# Patient Record
Sex: Male | Born: 1955 | Race: White | Hispanic: No | Marital: Married | Smoking: Never smoker
Health system: Southern US, Community
[De-identification: ages and names within clinical notes are randomized; demographics above are authoritative.]

## PROBLEM LIST (undated history)

## (undated) DIAGNOSIS — K219 Gastro-esophageal reflux disease without esophagitis: Secondary | ICD-10-CM

## (undated) DIAGNOSIS — R600 Localized edema: Secondary | ICD-10-CM

## (undated) DIAGNOSIS — G4730 Sleep apnea, unspecified: Secondary | ICD-10-CM

## (undated) DIAGNOSIS — M1990 Unspecified osteoarthritis, unspecified site: Secondary | ICD-10-CM

## (undated) DIAGNOSIS — C801 Malignant (primary) neoplasm, unspecified: Secondary | ICD-10-CM

## (undated) DIAGNOSIS — Z9989 Dependence on other enabling machines and devices: Secondary | ICD-10-CM

## (undated) DIAGNOSIS — M549 Dorsalgia, unspecified: Secondary | ICD-10-CM

## (undated) DIAGNOSIS — I10 Essential (primary) hypertension: Secondary | ICD-10-CM

## (undated) DIAGNOSIS — R609 Edema, unspecified: Secondary | ICD-10-CM

## (undated) DIAGNOSIS — E119 Type 2 diabetes mellitus without complications: Secondary | ICD-10-CM

## (undated) HISTORY — DX: Unspecified osteoarthritis, unspecified site: M19.90

## (undated) HISTORY — PX: POLYPECTOMY: SHX149

## (undated) HISTORY — PX: CHOLECYSTECTOMY: SHX55

## (undated) HISTORY — DX: Gastro-esophageal reflux disease without esophagitis: K21.9

## (undated) HISTORY — DX: Sleep apnea, unspecified: G47.30

## (undated) HISTORY — PX: COLONOSCOPY: SHX174

## (undated) HISTORY — PX: ROTATOR CUFF REPAIR: SHX139

## (undated) HISTORY — PX: TONSILLECTOMY: SUR1361

## (undated) HISTORY — DX: Type 2 diabetes mellitus without complications: E11.9

---

## 2008-03-24 ENCOUNTER — Ambulatory Visit: Payer: Self-pay | Admitting: Orthopaedic Surgery

## 2012-12-10 ENCOUNTER — Other Ambulatory Visit: Payer: Self-pay | Admitting: Orthopedic Surgery

## 2012-12-15 ENCOUNTER — Ambulatory Visit
Admission: RE | Admit: 2012-12-15 | Discharge: 2012-12-15 | Disposition: A | Discharge status: Home / Self Care | Source: Ambulatory Visit | Attending: Orthopedic Surgery | Admitting: Orthopedic Surgery

## 2012-12-15 DIAGNOSIS — M25511 Pain in right shoulder: Secondary | ICD-10-CM

## 2018-08-01 ENCOUNTER — Ambulatory Visit
Admission: RE | Admit: 2018-08-01 | Discharge: 2018-08-01 | Disposition: A | Discharge status: Home / Self Care | Source: Ambulatory Visit | Attending: Orthopedic Surgery | Admitting: Orthopedic Surgery

## 2018-08-01 ENCOUNTER — Other Ambulatory Visit: Payer: Self-pay | Admitting: Orthopedic Surgery

## 2018-08-01 DIAGNOSIS — M79642 Pain in left hand: Secondary | ICD-10-CM

## 2018-08-01 DIAGNOSIS — M79641 Pain in right hand: Secondary | ICD-10-CM

## 2018-08-01 DIAGNOSIS — M542 Cervicalgia: Secondary | ICD-10-CM

## 2018-10-07 ENCOUNTER — Other Ambulatory Visit: Payer: Self-pay | Admitting: Orthopedic Surgery

## 2018-10-07 DIAGNOSIS — S6982XD Other specified injuries of left wrist, hand and finger(s), subsequent encounter: Secondary | ICD-10-CM

## 2018-10-29 ENCOUNTER — Ambulatory Visit
Admission: RE | Admit: 2018-10-29 | Discharge: 2018-10-29 | Disposition: A | Discharge status: Home / Self Care | Source: Ambulatory Visit | Attending: Orthopedic Surgery | Admitting: Orthopedic Surgery

## 2018-10-29 DIAGNOSIS — S6982XD Other specified injuries of left wrist, hand and finger(s), subsequent encounter: Secondary | ICD-10-CM

## 2018-10-29 MED ORDER — IOPAMIDOL (ISOVUE-M 200) INJECTION 41%
1.5000 mL | Freq: Once | INTRAMUSCULAR | Status: AC
  Administered 2018-10-29: 1.5 mL via INTRA_ARTICULAR

## 2018-11-05 ENCOUNTER — Other Ambulatory Visit: Payer: Self-pay | Admitting: Orthopedic Surgery

## 2018-11-15 ENCOUNTER — Encounter (HOSPITAL_BASED_OUTPATIENT_CLINIC_OR_DEPARTMENT_OTHER): Payer: Self-pay | Admitting: *Deleted

## 2018-11-15 ENCOUNTER — Other Ambulatory Visit: Payer: Self-pay

## 2018-11-21 ENCOUNTER — Encounter (HOSPITAL_BASED_OUTPATIENT_CLINIC_OR_DEPARTMENT_OTHER)
Admission: RE | Admit: 2018-11-21 | Discharge: 2018-11-21 | Disposition: A | Discharge status: Home / Self Care | Source: Ambulatory Visit | Attending: Orthopedic Surgery | Admitting: Orthopedic Surgery

## 2018-11-21 DIAGNOSIS — Z79899 Other long term (current) drug therapy: Secondary | ICD-10-CM | POA: Diagnosis not present

## 2018-11-21 DIAGNOSIS — M25332 Other instability, left wrist: Secondary | ICD-10-CM | POA: Diagnosis not present

## 2018-11-21 DIAGNOSIS — I10 Essential (primary) hypertension: Secondary | ICD-10-CM | POA: Diagnosis not present

## 2018-11-21 DIAGNOSIS — M720 Palmar fascial fibromatosis [Dupuytren]: Secondary | ICD-10-CM | POA: Diagnosis not present

## 2018-11-21 DIAGNOSIS — Z833 Family history of diabetes mellitus: Secondary | ICD-10-CM | POA: Diagnosis not present

## 2018-11-21 DIAGNOSIS — X58XXXA Exposure to other specified factors, initial encounter: Secondary | ICD-10-CM | POA: Diagnosis not present

## 2018-11-21 DIAGNOSIS — M19032 Primary osteoarthritis, left wrist: Secondary | ICD-10-CM | POA: Diagnosis not present

## 2018-11-21 DIAGNOSIS — S638X2A Sprain of other part of left wrist and hand, initial encounter: Secondary | ICD-10-CM | POA: Diagnosis not present

## 2018-11-21 DIAGNOSIS — Z01818 Encounter for other preprocedural examination: Secondary | ICD-10-CM | POA: Insufficient documentation

## 2018-11-21 DIAGNOSIS — Z85828 Personal history of other malignant neoplasm of skin: Secondary | ICD-10-CM | POA: Diagnosis not present

## 2018-11-21 DIAGNOSIS — Z6841 Body Mass Index (BMI) 40.0 and over, adult: Secondary | ICD-10-CM | POA: Diagnosis not present

## 2018-11-21 DIAGNOSIS — Z881 Allergy status to other antibiotic agents status: Secondary | ICD-10-CM | POA: Diagnosis not present

## 2018-11-21 LAB — BASIC METABOLIC PANEL
ANION GAP: 9 (ref 5–15)
BUN: 16 mg/dL (ref 8–23)
CO2: 30 mmol/L (ref 22–32)
Calcium: 9.4 mg/dL (ref 8.9–10.3)
Chloride: 99 mmol/L (ref 98–111)
Creatinine, Ser: 1.18 mg/dL (ref 0.61–1.24)
GFR calc Af Amer: >60 mL/min (ref >60)
GFR calc non Af Amer: >60 mL/min (ref >60)
Glucose, Bld: 176 mg/dL — ABNORMAL HIGH (ref 70–99)
Potassium: 3.8 mmol/L (ref 3.5–5.1)
Sodium: 138 mmol/L (ref 135–145)

## 2018-11-21 NOTE — Progress Notes (Signed)
EKG reviewed by Dr. Carignan, will proceed with surgery as scheduled. 

## 2018-11-26 ENCOUNTER — Encounter (HOSPITAL_BASED_OUTPATIENT_CLINIC_OR_DEPARTMENT_OTHER): Payer: Self-pay | Admitting: *Deleted

## 2018-11-26 ENCOUNTER — Ambulatory Visit (HOSPITAL_BASED_OUTPATIENT_CLINIC_OR_DEPARTMENT_OTHER): Admitting: Certified Registered"

## 2018-11-26 ENCOUNTER — Ambulatory Visit (HOSPITAL_BASED_OUTPATIENT_CLINIC_OR_DEPARTMENT_OTHER)
Admission: RE | Admit: 2018-11-26 | Discharge: 2018-11-26 | Disposition: A | Discharge status: Home / Self Care | Attending: Orthopedic Surgery | Admitting: Orthopedic Surgery

## 2018-11-26 ENCOUNTER — Encounter (HOSPITAL_BASED_OUTPATIENT_CLINIC_OR_DEPARTMENT_OTHER)
Admission: RE | Disposition: A | Discharge status: Home / Self Care | Payer: Self-pay | Source: Home / Self Care | Attending: Orthopedic Surgery

## 2018-11-26 ENCOUNTER — Other Ambulatory Visit: Payer: Self-pay

## 2018-11-26 DIAGNOSIS — X58XXXA Exposure to other specified factors, initial encounter: Secondary | ICD-10-CM | POA: Insufficient documentation

## 2018-11-26 DIAGNOSIS — S638X2A Sprain of other part of left wrist and hand, initial encounter: Secondary | ICD-10-CM | POA: Insufficient documentation

## 2018-11-26 DIAGNOSIS — M720 Palmar fascial fibromatosis [Dupuytren]: Secondary | ICD-10-CM | POA: Insufficient documentation

## 2018-11-26 DIAGNOSIS — Z6841 Body Mass Index (BMI) 40.0 and over, adult: Secondary | ICD-10-CM | POA: Insufficient documentation

## 2018-11-26 DIAGNOSIS — I10 Essential (primary) hypertension: Secondary | ICD-10-CM | POA: Insufficient documentation

## 2018-11-26 DIAGNOSIS — Z833 Family history of diabetes mellitus: Secondary | ICD-10-CM | POA: Insufficient documentation

## 2018-11-26 DIAGNOSIS — Z79899 Other long term (current) drug therapy: Secondary | ICD-10-CM | POA: Insufficient documentation

## 2018-11-26 DIAGNOSIS — M25332 Other instability, left wrist: Secondary | ICD-10-CM | POA: Insufficient documentation

## 2018-11-26 DIAGNOSIS — Z85828 Personal history of other malignant neoplasm of skin: Secondary | ICD-10-CM | POA: Insufficient documentation

## 2018-11-26 DIAGNOSIS — M19032 Primary osteoarthritis, left wrist: Secondary | ICD-10-CM | POA: Insufficient documentation

## 2018-11-26 DIAGNOSIS — Z881 Allergy status to other antibiotic agents status: Secondary | ICD-10-CM | POA: Insufficient documentation

## 2018-11-26 HISTORY — DX: Essential (primary) hypertension: I10

## 2018-11-26 HISTORY — PX: WRIST ARTHROSCOPY: SHX838

## 2018-11-26 HISTORY — DX: Edema, unspecified: R60.9

## 2018-11-26 HISTORY — DX: Localized edema: R60.0

## 2018-11-26 HISTORY — DX: Dorsalgia, unspecified: M54.9

## 2018-11-26 HISTORY — DX: Malignant (primary) neoplasm, unspecified: C80.1

## 2018-11-26 SURGERY — ARTHROSCOPY, WRIST
Anesthesia: Regional | Site: Wrist | Laterality: Left

## 2018-11-26 MED ORDER — CEFAZOLIN SODIUM-DEXTROSE 1-4 GM/50ML-% IV SOLN
INTRAVENOUS | Status: DC | PRN
  Administered 2018-11-26: 3 g via INTRAVENOUS

## 2018-11-26 MED ORDER — ROPIVACAINE HCL 5 MG/ML IJ SOLN
INTRAMUSCULAR | Status: DC | PRN
  Administered 2018-11-26: 30 mL via PERINEURAL

## 2018-11-26 MED ORDER — CEFAZOLIN SODIUM-DEXTROSE 1-4 GM/50ML-% IV SOLN
INTRAVENOUS | Status: AC
  Filled 2018-11-26: qty 50

## 2018-11-26 MED ORDER — PROPOFOL 500 MG/50ML IV EMUL
INTRAVENOUS | Status: DC | PRN
  Administered 2018-11-26: 50 ug/kg/min via INTRAVENOUS

## 2018-11-26 MED ORDER — TRAMADOL HCL 50 MG PO TABS: 50.0000 mg | ORAL_TABLET | Freq: Four times a day (QID) | ORAL | 0 refills | Status: DC | PRN

## 2018-11-26 MED ORDER — FENTANYL CITRATE (PF) 100 MCG/2ML IJ SOLN
50.0000 ug | INTRAMUSCULAR | Status: DC | PRN
  Administered 2018-11-26: 50 ug via INTRAVENOUS

## 2018-11-26 MED ORDER — LACTATED RINGERS IV SOLN
INTRAVENOUS | Status: DC
  Administered 2018-11-26: 08:00:00 via INTRAVENOUS

## 2018-11-26 MED ORDER — SCOPOLAMINE 1 MG/3DAYS TD PT72: 1.0000 | MEDICATED_PATCH | Freq: Once | TRANSDERMAL | Status: DC | PRN

## 2018-11-26 MED ORDER — MIDAZOLAM HCL 2 MG/2ML IJ SOLN
1.0000 mg | INTRAMUSCULAR | Status: DC | PRN
  Administered 2018-11-26 (×2): 1 mg via INTRAVENOUS

## 2018-11-26 MED ORDER — OXYCODONE HCL 5 MG/5ML PO SOLN: 5.0000 mg | Freq: Once | ORAL | Status: DC | PRN

## 2018-11-26 MED ORDER — MIDAZOLAM HCL 2 MG/2ML IJ SOLN
INTRAMUSCULAR | Status: AC
  Filled 2018-11-26: qty 2

## 2018-11-26 MED ORDER — OXYCODONE HCL 5 MG PO TABS: 5.0000 mg | ORAL_TABLET | Freq: Once | ORAL | Status: DC | PRN

## 2018-11-26 MED ORDER — FENTANYL CITRATE (PF) 100 MCG/2ML IJ SOLN
INTRAMUSCULAR | Status: AC
  Filled 2018-11-26: qty 2

## 2018-11-26 MED ORDER — ONDANSETRON HCL 4 MG/2ML IJ SOLN: 4.0000 mg | Freq: Once | INTRAMUSCULAR | Status: DC | PRN

## 2018-11-26 MED ORDER — CEFAZOLIN SODIUM-DEXTROSE 2-4 GM/100ML-% IV SOLN
INTRAVENOUS | Status: AC
  Filled 2018-11-26: qty 100

## 2018-11-26 MED ORDER — CHLORHEXIDINE GLUCONATE 4 % EX LIQD: 60.0000 mL | Freq: Once | CUTANEOUS | Status: DC

## 2018-11-26 MED ORDER — DEXTROSE 5 % IV SOLN: 3.0000 g | INTRAVENOUS | Status: DC

## 2018-11-26 MED ORDER — SODIUM CHLORIDE 0.9 % IR SOLN
Status: DC | PRN
  Administered 2018-11-26: 1500 mL

## 2018-11-26 MED ORDER — ONDANSETRON HCL 4 MG/2ML IJ SOLN
INTRAMUSCULAR | Status: DC | PRN
  Administered 2018-11-26: 4 mg via INTRAVENOUS

## 2018-11-26 SURGICAL SUPPLY — 86 items
BLADE CUDA 2.0 (BLADE) IMPLANT
BLADE EAR TYMPAN 2.5 60D BEAV (BLADE) ×2 IMPLANT
BLADE MINI RND TIP GREEN BEAV (BLADE) IMPLANT
BLADE SURG 15 STRL LF DISP TIS (BLADE) ×1 IMPLANT
BLADE SURG 15 STRL SS (BLADE) ×2
BNDG COHESIVE 3X5 TAN STRL LF (GAUZE/BANDAGES/DRESSINGS) ×3 IMPLANT
BNDG ESMARK 4X9 LF (GAUZE/BANDAGES/DRESSINGS) IMPLANT
BNDG GAUZE ELAST 4 BULKY (GAUZE/BANDAGES/DRESSINGS) ×3 IMPLANT
BUR CUDA 2.9 (BURR) IMPLANT
BUR CUDA 2.9MM (BURR)
BUR FULL RADIUS 2.0 (BURR) IMPLANT
BUR FULL RADIUS 2.0MM (BURR)
BUR FULL RADIUS 2.9 (BURR) IMPLANT
BUR FULL RADIUS 2.9MM (BURR)
BUR GATOR 2.9 (BURR) IMPLANT
BUR GATOR 2.9MM (BURR)
BUR SPHERICAL 2.9 (BURR) IMPLANT
BUR SPHERICAL 2.9MM (BURR)
CANISTER SUCT 1200ML W/VALVE (MISCELLANEOUS) ×2 IMPLANT
CHLORAPREP W/TINT 26ML (MISCELLANEOUS) ×3 IMPLANT
CORD BIPOLAR FORCEPS 12FT (ELECTRODE) IMPLANT
COVER BACK TABLE 60X90IN (DRAPES) ×3 IMPLANT
COVER MAYO STAND STRL (DRAPES) ×3 IMPLANT
COVER WAND RF STERILE (DRAPES) IMPLANT
CUFF TOURN SGL QUICK 24 (TOURNIQUET CUFF) ×2
CUFF TOURNIQUET SINGLE 18IN (TOURNIQUET CUFF) IMPLANT
CUFF TRNQT CYL 24X4X16.5-23 (TOURNIQUET CUFF) IMPLANT
DRAPE EXTREMITY T 121X128X90 (DISPOSABLE) ×3 IMPLANT
DRAPE IMP U-DRAPE 54X76 (DRAPES) ×3 IMPLANT
DRAPE OEC MINIVIEW 54X84 (DRAPES) IMPLANT
DRAPE SURG 17X23 STRL (DRAPES) ×3 IMPLANT
ELECT SMALL JOINT 90D BASC (ELECTRODE) IMPLANT
GAUZE SPONGE 4X4 12PLY STRL (GAUZE/BANDAGES/DRESSINGS) ×3 IMPLANT
GAUZE XEROFORM 1X8 LF (GAUZE/BANDAGES/DRESSINGS) ×3 IMPLANT
GLOVE BIO SURGEON STRL SZ 6.5 (GLOVE) ×1 IMPLANT
GLOVE BIO SURGEONS STRL SZ 6.5 (GLOVE) ×1
GLOVE BIOGEL PI IND STRL 7.0 (GLOVE) IMPLANT
GLOVE BIOGEL PI IND STRL 8.5 (GLOVE) ×1 IMPLANT
GLOVE BIOGEL PI INDICATOR 7.0 (GLOVE) ×2
GLOVE BIOGEL PI INDICATOR 8.5 (GLOVE) ×2
GLOVE SURG ORTHO 8.0 STRL STRW (GLOVE) ×3 IMPLANT
GOWN STRL REUS W/ TWL LRG LVL3 (GOWN DISPOSABLE) ×1 IMPLANT
GOWN STRL REUS W/TWL LRG LVL3 (GOWN DISPOSABLE) ×2
GOWN STRL REUS W/TWL XL LVL3 (GOWN DISPOSABLE) ×3 IMPLANT
IV NS IRRIG 3000ML ARTHROMATIC (IV SOLUTION) ×3 IMPLANT
IV SET EXT 30 76VOL 4 MALE LL (IV SETS) ×3 IMPLANT
NDL EPIDURAL TUOHY 20GX3.5 (NEEDLE) IMPLANT
NDL SAFETY ECLIPSE 18X1.5 (NEEDLE) ×3 IMPLANT
NDL SPNL 18GX3.5 QUINCKE PK (NEEDLE) IMPLANT
NEEDLE HYPO 18GX1.5 SHARP (NEEDLE) ×2
NEEDLE HYPO 22GX1.5 SAFETY (NEEDLE) ×3 IMPLANT
NEEDLE SPNL 18GX3.5 QUINCKE PK (NEEDLE) IMPLANT
NEEDLE TUOHY 20GX3.5 (NEEDLE) IMPLANT
NS IRRIG 1000ML POUR BTL (IV SOLUTION) IMPLANT
PACK BASIN DAY SURGERY FS (CUSTOM PROCEDURE TRAY) ×3 IMPLANT
PAD CAST 3X4 CTTN HI CHSV (CAST SUPPLIES) ×1 IMPLANT
PADDING CAST ABS 3INX4YD NS (CAST SUPPLIES)
PADDING CAST ABS 4INX4YD NS (CAST SUPPLIES) ×2
PADDING CAST ABS COTTON 3X4 (CAST SUPPLIES) ×1 IMPLANT
PADDING CAST ABS COTTON 4X4 ST (CAST SUPPLIES) ×1 IMPLANT
PADDING CAST COTTON 3X4 STRL (CAST SUPPLIES) ×2
ROUTER HOODED VORTEX 2.9MM (BLADE) IMPLANT
SET SM JOINT TUBING/CANN (CANNULA) IMPLANT
SHAVER DISSECTOR 3.0 (BURR) ×2 IMPLANT
SHAVER SABRE 2.0 (BURR) ×2 IMPLANT
SLEEVE SCD COMPRESS KNEE MED (MISCELLANEOUS) IMPLANT
SLING ARM XL FOAM STRAP (SOFTGOODS) ×2 IMPLANT
SPLINT PLASTER CAST XFAST 3X15 (CAST SUPPLIES) IMPLANT
SPLINT PLASTER XTRA FASTSET 3X (CAST SUPPLIES) ×20
STOCKINETTE 4X48 STRL (DRAPES) ×3 IMPLANT
SUCTION FRAZIER HANDLE 10FR (MISCELLANEOUS)
SUCTION TUBE FRAZIER 10FR DISP (MISCELLANEOUS) IMPLANT
SUT ETHILON 4 0 PS 2 18 (SUTURE) ×2 IMPLANT
SUT MERSILENE 4 0 P 3 (SUTURE) IMPLANT
SUT PDS AB 2-0 CT2 27 (SUTURE) IMPLANT
SUT STEEL 4 0 (SUTURE) IMPLANT
SUT VIC AB 2-0 PS2 27 (SUTURE) IMPLANT
SUT VICRYL 4-0 PS2 18IN ABS (SUTURE) IMPLANT
SYR BULB 3OZ (MISCELLANEOUS) ×3 IMPLANT
SYR CONTROL 10ML LL (SYRINGE) ×3 IMPLANT
TUBE CONNECTING 20'X1/4 (TUBING) ×1
TUBE CONNECTING 20X1/4 (TUBING) ×1 IMPLANT
TUBING ARTHROSCOPY IRRIG 16FT (MISCELLANEOUS) ×2 IMPLANT
UNDERPAD 30X30 (UNDERPADS AND DIAPERS) ×3 IMPLANT
WAND SHORT BEVEL W/CORD (SURGICAL WAND) IMPLANT
WATER STERILE IRR 1000ML POUR (IV SOLUTION) ×1 IMPLANT

## 2018-11-26 NOTE — Op Note (Signed)
NAME: Trevor Adams MEDICAL RECORD NO: [id number] DATE OF BIRTH: 02/28/1956 FACILITY: [facility] LOCATION: [facility] PHYSICIAN: Wynonia Sours, MD   OPERATIVE REPORT   DATE OF PROCEDURE: 11/26/18    PREOPERATIVE DIAGNOSIS:   Ulnar-sided wrist pain left wrist   POSTOPERATIVE DIAGNOSIS:   Same   PROCEDURE:   Arthroscopy left wrist with debridement TFCC lunotriquetral tear SURGEON: Daryll Brod, M.D.   ASSISTANT: none   ANESTHESIA:  Regional with sedation   INTRAVENOUS FLUIDS:  Per anesthesia flow sheet.   ESTIMATED BLOOD LOSS:  Minimal.   COMPLICATIONS:  None.   SPECIMENS:  none   TOURNIQUET TIME:    Total Tourniquet Time Documented: Upper Arm (Left) - 22 minutes Total: Upper Arm (Left) - 22 minutes    DISPOSITION:  Stable to PACU.   INDICATIONS: Patient is a 63 year old male with a long history of ulnar-sided wrist pain.  This has not responded to conservative treatment MRI reveals a TFCC tear.  He is elected to undergo arthroscopic inspection debridement repair as dictated by findings.  5 a significant injury is present requiring open repair the arthroscopy will be diagnostic.  Pre-peri-and postoperative course been discussed along with risks and complications.  He is aware that there is no guarantee to the surgery the possibility of infection recurrence injury to arteries nerves tendons incomplete relief symptoms this probability of further intervention being necessary.  Preoperative area the patient is seen the extremity marked by both patient and surgeon antibiotic given  OPERATIVE COURSE: Patient is brought to the operating room following a supraclavicular block done by the anesthesia department.  This was done in the preoperative area.  He was prepped and draped in supine position with left arm free.  Prep was done with ChloraPrep a 3-minute dry time was allowed and timeout taken to confirm patient procedure.  After draping the left arm was placed in the  arc arthroscopy tower.  10 pounds of traction was applied.  The joint was inflated through the 3-4 portal a transverse incision made deepened with a hemostat blunt trocar used to enter the joint.  The arthroscope was introduced allowing visualization of the volar radial wrist ligaments which were intact there was no significant damage to the distal radial articular surface of the proximal aspect of the of the scaphoid or lunate.  There was slight stretching of the scapholunate ligament complex.  The ulnar side of the wrist examination revealed a significant tear of the triangular fibrocartilage complex.  And irrigation catheter was placed in 6 you.  Obvious lunotriquetral injury was apparent.  Significant synovitis was present with fibrillation of the cartilage and ligament from the lunotriquetral joint hanging into the joint.  A 4-5 portal was then made after localization with a 22-gauge needle.  Transverse incision made deepened with a hemostat blunt trocar used to enter the joint.  Probe was inserted on the triangle fibrocartilage tear was identified and outlined.  The scope was then introduced into the 4-5 portal from the 3-4 portal.  This allowed visualization of the lunotriquetral tear.  The scope was removed a inflation of the midcarpal joint was done through the ulnar midcarpal portal localized with a 22-gauge needle transverse incision made deepened with a hemostat blunt trocar used to enter the joint joint was then inspected after insertion of the arthroscope.  There was significant instability of the lunotriquetral joint.  There was mild changes of the proximal hamate.  The proximal capitate showed no significant degenerative changes.  The scapholunate  ligament complex appeared to be tight.  There was no articular damage on either of the distal portion of the middle are the proximal aspect of the capitate.  The scope was then reintroduced in the 3-4 portal.  A shaver was introduced in the 4-5 portal.  A  debridement was then performed for the lunotriquetral joint and the articular cartilage which was damaged.  A angled Beaver blade for eye surgery was then introduced and endings incision was made into the triangular fibrocartilage complex allowing removal of the torn portion.  The torn portion was then removed with a grasper.  A 3 mm shaver was then introduced and the margins of the triangular fibrocartilage then was smoothed with the shaver.  The instruments were removed.  There was no significant damage to the distal articular surface of the the ulna.  The portals were closed interrupted 4-0 nylon sutures.  A sterile compressive dressing volar splint was applied.  Tourniquet was deflated needed has been inflated midway through the arthroscopy to due to moderate bleeding from one dorsal vein at the midcarpal portal.  She tolerated the procedure well was taken to the recovery room for observation in satisfactory condition.  He was discharged home return the [facility] in 1 week on Tylenol ibuprofen for pain with Ultram as a backup.   Daryll Brod, MD Electronically signed, 11/26/18

## 2018-11-26 NOTE — Brief Op Note (Signed)
11/26/2018  10:21 AM  PATIENT:  Trevor Adams  63 y.o. male  PRE-OPERATIVE DIAGNOSIS:  TRIANGULAR FIBROCARTILAGE COMPLEX/SCAPHOLUNATE TEAR LEFT WRIST  POST-OPERATIVE DIAGNOSIS:  TRIANGULAR FIBROCARTILAGE COMPLEX/SCAPHOLUNATE TEAR LEFT WRIST  PROCEDURE:  Procedure(s): ARTHROSCOPY LEFT WRIST,  DEBRIDEMENT OF TRIANGULAR FIBROCARTILAGE TEAR (Left)  SURGEON:  Surgeon(s) and Role:    Daryll Brod, MD - Primary  PHYSICIAN ASSISTANT:   ASSISTANTS: none   ANESTHESIA:   regional and IV sedation  EBL:  10 mL   BLOOD ADMINISTERED:none  DRAINS: none   LOCAL MEDICATIONS USED:  NONE  SPECIMEN:  No Specimen  DISPOSITION OF SPECIMEN:  N/A  COUNTS:  YES  TOURNIQUET:   Total Tourniquet Time Documented: Upper Arm (Left) - 22 minutes Total: Upper Arm (Left) - 22 minutes   DICTATION: .Viviann Spare Dictation  PLAN OF CARE: Discharge to home after PACU  PATIENT DISPOSITION:  PACU - hemodynamically stable.

## 2018-11-26 NOTE — Anesthesia Procedure Notes (Signed)
Anesthesia Regional Block: Supraclavicular block   Pre-Anesthetic Checklist: ,, timeout performed, Correct Patient, Correct Site, Correct Laterality, Correct Procedure, Correct Position, site marked, Risks and benefits discussed,  Surgical consent,  Pre-op evaluation,  At surgeon's request and post-op pain management  Laterality: Left  Prep: chloraprep       Needles:  Injection technique: Single-shot  Needle Type: Echogenic Stimulator Needle     Needle Length: 9cm  Needle Gauge: 21     Additional Needles:   Procedures:,,,, ultrasound used (permanent image in chart),,,,  Narrative:  Start time: 11/26/2018 8:25 AM End time: 11/26/2018 8:33 AM Injection made incrementally with aspirations every 5 mL.  Performed by: Personally  Anesthesiologist: Lidia Collum, MD  Additional Notes: Monitors applied. Injection made in 5cc increments. No resistance to injection. Good needle visualization. Patient tolerated procedure well.

## 2018-11-26 NOTE — Discharge Instructions (Signed)

## 2018-11-26 NOTE — H&P (Signed)
  Trevor Adams is an 63 y.o. male.   Chief Complaint: Ulnar wrist pain left hand HPI: Trevor Adams is a 63yo male who complains of his wrist on his left side. Is a VAS score 1 over sitting 4-5 overuse with pain on the radial ulnar aspect and dorsally. Many years ago to the wrist. He complains of an aching pain. This can be as high as 8/10 on the ulnar side. He has had multiple injections which have given him temporary relief. He has had a de Quervain's which has been injected and state appears to have resolved. He does have a Dupuytren's cord to his ring finger but no contracture. He has a history of arthritis no history of diabetes thyroid problems or gout. Family history is positive diabetes and negative for thyroid problems or gout but does have a family history of arthritis.  His MRI reveals a tear of the triangular fibrocartilage complex a partial tear of the interosseous component scapholunate ligament. And scaphoid trapezial trapezoid arthritis.   Past Medical History:  Diagnosis Date  . Cancer ([place])    skin cancer removed  . Hypertension     Past Surgical History:  Procedure Laterality Date  . CHOLECYSTECTOMY    . shoulder surgery     bilateral  . TONSILLECTOMY     1960    Family History  Problem Relation Age of Onset  . Diabetes Mother    Social History:  reports that he has never smoked. He has never used smokeless tobacco. He reports previous alcohol use. He reports that he does not use drugs.  Allergies:  Allergies  Allergen Reactions  . Azithromycin Hives    No medications prior to admission.    No results found for this or any previous visit (from the past 48 hour(s)).  No results found.   Pertinent items are noted in HPI.  Height 5\' 11"  (1.803 m), weight (!) 145.8 kg.  General appearance: alert, cooperative and appears stated age Head: Normocephalic, without obvious abnormality Neck: no JVD Resp: clear to auscultation bilaterally Cardio: regular rate  and rhythm, S1, S2 normal, no murmur, click, rub or gallop GI: soft, non-tender; bowel sounds normal; no masses,  no organomegaly Extremities: left wrist ulnar apin Pulses: 2+ and symmetric Skin: Skin color, texture, turgor normal. No rashes or lesions Neurologic: Grossly normal Incision/Wound: na  Assessment/Plan Assessment:  1. 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 Contracture of palmar fascia  3. De Quervain's syndrome (tenosynovitis)  4. Scapholunate instability of left wrist    Plan: We have discussed possibility of arthroscopic inspection debridement shrinkage of his wrist. He would like to have that performed. Is a scheduled as an outpatient under regional anesthesia. He is aware that this may not resolve all of his symptoms. We are looking to try to make it better if there is significant injury that is going to require significant reconstruction we will sit down and talk to him about that. Is in agreement this scheduled as an outpatient under regional anesthesia left wrist arthroscopy.     Trevor Adams 11/26/2018, 5:21 AM

## 2018-11-26 NOTE — Transfer of Care (Signed)
Immediate Anesthesia Transfer of Care Note  Patient: Trevor Adams  Procedure(s) Performed: ARTHROSCOPY LEFT WRIST,  DEBRIDEMENT OF TRIANGULAR FIBROCARTILAGE TEAR (Left Wrist)  Patient Location: PACU  Anesthesia Type:General  Level of Consciousness: awake, alert  and oriented  Airway & Oxygen Therapy: Patient Spontanous Breathing and Patient connected to face mask oxygen  Post-op Assessment: Report given to RN and Post -op Vital signs reviewed and stable  Post vital signs: Reviewed and stable  Last Vitals:  Vitals Value Taken Time  BP    Temp    Pulse 64 11/26/2018 10:26 AM  Resp    SpO2 93 % 11/26/2018 10:26 AM  Vitals shown include unvalidated device data.  Last Pain:  Vitals:   11/26/18 0832  TempSrc:   PainSc: 0-No pain      Patients Stated Pain Goal: 3 (43/01/48 4039)  Complications: No apparent anesthesia complications

## 2018-11-26 NOTE — Progress Notes (Signed)
Assisted Dr. Witman with left, ultrasound guided, supraclavicular block. Side rails up, monitors on throughout procedure. See vital signs in flow sheet. Tolerated Procedure well. °

## 2018-11-26 NOTE — Anesthesia Preprocedure Evaluation (Addendum)
Anesthesia Evaluation  Patient identified by MRN, date of birth, ID band Patient awake    Reviewed: Allergy & Precautions, NPO status , Patient's Chart, lab work & pertinent test results  History of Anesthesia Complications Negative for: history of anesthetic complications  Airway Mallampati: III  TM Distance: >3 FB Neck ROM: Full    Dental no notable dental hx.    Pulmonary neg pulmonary ROS,    Pulmonary exam normal        Cardiovascular hypertension, Pt. on medications and Pt. on home beta blockers Normal cardiovascular exam     Neuro/Psych negative neurological ROS  negative psych ROS   GI/Hepatic Neg liver ROS, GERD  ,  Endo/Other  Morbid obesity  Renal/GU negative Renal ROS  negative genitourinary   Musculoskeletal negative musculoskeletal ROS (+)   Abdominal (+) + obese,   Peds  Hematology negative hematology ROS (+)   Anesthesia Other Findings 63 yo M for left wrist arthroscopy & debridement - HTN, GERD, BMI 44  Reproductive/Obstetrics                            Anesthesia Physical Anesthesia Plan  ASA: III  Anesthesia Plan: Regional   Post-op Pain Management:  Regional for Post-op pain   Induction:   PONV Risk Score and Plan: 1 and Propofol infusion and Treatment may vary due to age or medical condition  Airway Management Planned: Nasal Cannula and Simple Face Mask  Additional Equipment: None  Intra-op Plan:   Post-operative Plan:   Informed Consent: I have reviewed the patients History and Physical, chart, labs and discussed the procedure including the risks, benefits and alternatives for the proposed anesthesia with the patient or authorized representative who has indicated his/her understanding and acceptance.       Plan Discussed with:   Anesthesia Plan Comments:        Anesthesia Quick Evaluation

## 2018-11-26 NOTE — Anesthesia Postprocedure Evaluation (Signed)
Anesthesia Post Note  Patient: Trevor Adams  Procedure(s) Performed: ARTHROSCOPY LEFT WRIST,  DEBRIDEMENT OF TRIANGULAR FIBROCARTILAGE TEAR (Left Wrist)     Patient location during evaluation: PACU Anesthesia Type: Regional Level of consciousness: awake and alert Pain management: pain level controlled Vital Signs Assessment: post-procedure vital signs reviewed and stable Respiratory status: spontaneous breathing, nonlabored ventilation and respiratory function stable Cardiovascular status: blood pressure returned to baseline and stable Postop Assessment: no apparent nausea or vomiting Anesthetic complications: no    Last Vitals:  Vitals:   11/26/18 1039 11/26/18 1100  BP:  128/83  Pulse: (!) 58 (!) 56  Resp:  16  Temp:  36.5 C  SpO2: 96% 96%    Last Pain:  Vitals:   11/26/18 1100  TempSrc:   PainSc: 0-No pain                 Lidia Collum

## 2018-11-29 ENCOUNTER — Encounter (HOSPITAL_BASED_OUTPATIENT_CLINIC_OR_DEPARTMENT_OTHER): Payer: Self-pay | Admitting: Orthopedic Surgery

## 2019-05-26 ENCOUNTER — Ambulatory Visit
Admission: EM | Admit: 2019-05-26 | Discharge: 2019-05-26 | Disposition: A | Discharge status: Home / Self Care | Attending: Physician Assistant | Admitting: Physician Assistant

## 2019-05-26 ENCOUNTER — Other Ambulatory Visit: Payer: Self-pay

## 2019-05-26 DIAGNOSIS — Z20828 Contact with and (suspected) exposure to other viral communicable diseases: Secondary | ICD-10-CM | POA: Diagnosis not present

## 2019-05-26 DIAGNOSIS — Z20822 Contact with and (suspected) exposure to covid-19: Secondary | ICD-10-CM

## 2019-05-26 HISTORY — DX: Dependence on other enabling machines and devices: Z99.89

## 2019-05-26 NOTE — Discharge Instructions (Signed)
COVID testing ordered. As discussed, given recent exposure without symptoms, you may still be in incubation period. Monitor for any symptoms such as cough, congestion, shortness of breath, loss of taste/smell, fever, to start self quarantine and may need retesting. Go to the emergency department for further evaluation if you develop significant shortness of breath, cannot speak in full sentences.  

## 2019-05-26 NOTE — ED Provider Notes (Signed)
[facility]    CSN: [id number] Arrival date & time: 05/26/19  1132      History   Chief Complaint Chief Complaint  Patient presents with  . covid testing    HPI Schyler Mckeen is a 63 y.o. male.   63 year old male comes in for COVID testing after positive exposure.  States exposure was about 5 to 6 days ago.  He is asymptomatic.  Denies fever, chills, body aches.  Denies URI symptoms such as cough, congestion, sore throat.  Denies abdominal pain, nausea, vomiting, diarrhea.  Denies loss of taste or smell.  Has not taken antipyretics in the last 8 hours.     Past Medical History:  Diagnosis Date  . Back pain   . Cancer ([place])    skin cancer removed  . CPAP (continuous positive airway pressure) dependence   . Hypertension   . Peripheral edema     There are no active problems to display for this patient.   Past Surgical History:  Procedure Laterality Date  . CHOLECYSTECTOMY    . ROTATOR CUFF REPAIR     bilateral  . TONSILLECTOMY     1960  . WRIST ARTHROSCOPY Left 11/26/2018   Procedure: ARTHROSCOPY LEFT WRIST,  DEBRIDEMENT OF TRIANGULAR FIBROCARTILAGE TEAR;  Surgeon: Daryll Brod, MD;  Location: [facility];  Service: Orthopedics;  Laterality: Left;       Home Medications    Prior to Admission medications   Medication Sig Start Date End Date Taking? Authorizing Provider  amLODipine (NORVASC) 10 MG tablet Take 10 mg by mouth daily.    [provider]  aspirin EC 81 MG tablet Take 81 mg by mouth daily.    [provider]  atorvastatin (LIPITOR) 20 MG tablet Take 20 mg by mouth daily.    [provider]  furosemide (LASIX) 20 MG tablet Take 20 mg by mouth.    [provider]  meloxicam (MOBIC) 15 MG tablet Take 15 mg by mouth daily.    [provider]  metoprolol succinate (TOPROL-XL) 50 MG 24 hr tablet Take 50 mg by mouth daily. Take with or immediately following a meal.    [provider]  Multiple Vitamin (MULTIVITAMIN WITH MINERALS) TABS tablet Take 1 tablet by mouth daily.    [provider]  omeprazole (PRILOSEC) 10 MG capsule Take 20 mg by mouth daily.    [provider]  traMADol (ULTRAM) 50 MG tablet Take 1 tablet (50 mg total) by mouth every 6 (six) hours as needed. 11/26/18   Daryll Brod, MD  valsartan-hydrochlorothiazide (DIOVAN-HCT) 320-25 MG tablet Take 1 tablet by mouth daily.    [provider]    Family History Family History  Problem Relation Age of Onset  . Diabetes Mother   . Healthy Father     Social History Social History   Tobacco Use  . Smoking status: Never Smoker  . Smokeless tobacco: Never Used  Substance Use Topics  . Alcohol use: Not Currently  . Drug use: Never     Allergies   Azithromycin   Review of Systems Review of Systems  Reason unable to perform ROS: See HPI as above.     Physical Exam Triage Vital Signs ED Triage Vitals [05/26/19 1138]  Enc Vitals Group     BP (!) 143/90     Pulse Rate 68     Resp 18     Temp 98.4 F (36.9 C)     Temp  Source Oral     SpO2 96 %     Weight      Height      Head Circumference      Peak Flow      Pain Score 0     Pain Loc      Pain Edu?      Excl. in [place]?    No data found.  Updated Vital Signs BP (!) 143/90 (BP Location: Left Arm)   Pulse 68   Temp 98.4 F (36.9 C) (Oral)   Resp 18   SpO2 96%   Physical Exam Constitutional:      General: He is not in acute distress.    Appearance: Normal appearance. He is not ill-appearing, toxic-appearing or diaphoretic.  HENT:     Head: Normocephalic and atraumatic.     Mouth/Throat:     Mouth: Mucous membranes are moist.     Pharynx: Oropharynx is clear. Uvula midline.  Neck:     Musculoskeletal: Normal range of motion and neck supple.  Cardiovascular:     Rate and Rhythm: Normal rate and regular rhythm.     Heart sounds: Normal heart sounds. No murmur. No friction rub. No gallop.    Pulmonary:     Effort: Pulmonary effort is normal. No accessory muscle usage, prolonged expiration, respiratory distress or retractions.     Comments: Lungs clear to auscultation without adventitious lung sounds. Neurological:     General: No focal deficit present.     Mental Status: He is alert and oriented to person, place, and time.      UC Treatments / Results  Labs (all labs ordered are listed, but only abnormal results are displayed) Labs Reviewed  NOVEL CORONAVIRUS, NAA    EKG   Radiology No results found.  Procedures Procedures (including critical care time)  Medications Ordered in UC Medications - No data to display  Initial Impression / Assessment and Plan / UC Course  I have reviewed the triage vital signs and the nursing notes.  Pertinent labs & imaging results that were available during my care of the patient were reviewed by me and considered in my medical decision making (see chart for details).    Discussed with patient, given positive exposure without symptoms, could still be within incubation period. If develop symptoms, may need retesting.  Patient expresses understanding and would like to proceed with testing.  COVID testing ordered.  Patient will continue to monitor symptoms.  To self quarantine if develop any symptoms.  Return precautions given.  Final Clinical Impressions(s) / UC Diagnoses   Final diagnoses:  Exposure to Covid-19 Virus    ED Prescriptions    None        Ok Edwards, PA-C 05/26/19 1335

## 2019-05-26 NOTE — ED Triage Notes (Signed)
Pt presents to UC with c/o exposure to employee who had a positive COVID test result. Pt is coming to be screened for COVID. Denies having any symptoms at this time.

## 2019-05-28 ENCOUNTER — Encounter (HOSPITAL_COMMUNITY): Payer: Self-pay

## 2019-05-28 LAB — NOVEL CORONAVIRUS, NAA: SARS-CoV-2, NAA: NOT DETECTED

## 2019-06-07 IMAGING — MR MR WRIST*L* W/CM
6 series · 40 of 40 positions shown · IV contrast (agent unspecified)
Comparison: None.

CONTRAST:  See injection documentation.

CLINICAL DATA: Severe left wrist pain for the past 6 months.

EXAM:
MR OF THE LEFT WRIST WITH CONTRAST (MR ARTHROGRAM)
TECHNIQUE: Multiplanar, multisequence MR imaging of the left wrist was
performed following the administration of intra-articular contrast.

[Series 3: T1 fat-sat · axial · 3.0mm · 0.20mm/px · z∈[-13,+70]mm · 7 of 25 slices shown]
[im 1/25]
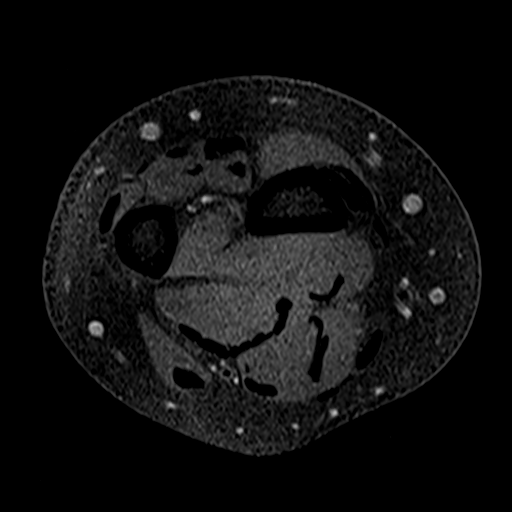
[im 5/25]
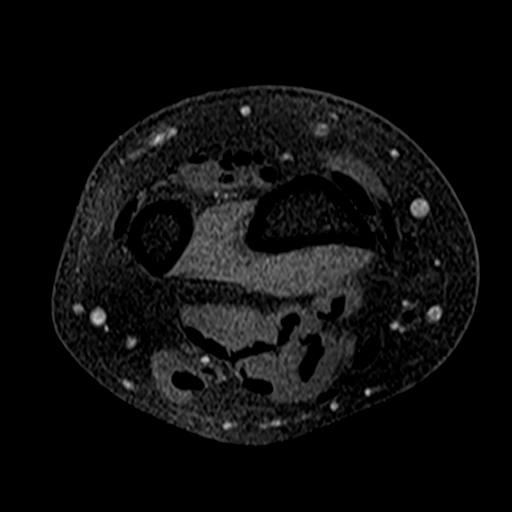
[im 9/25]
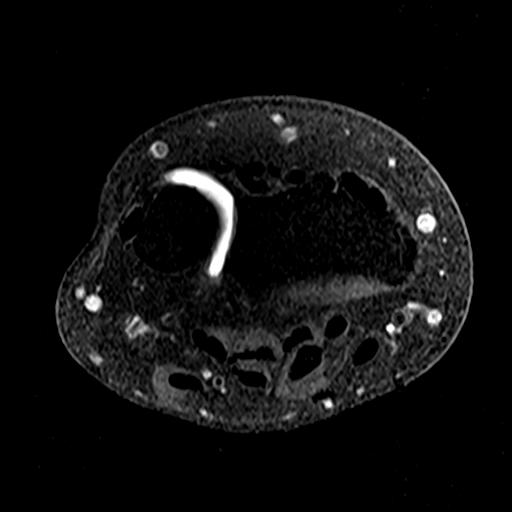
[im 13/25]
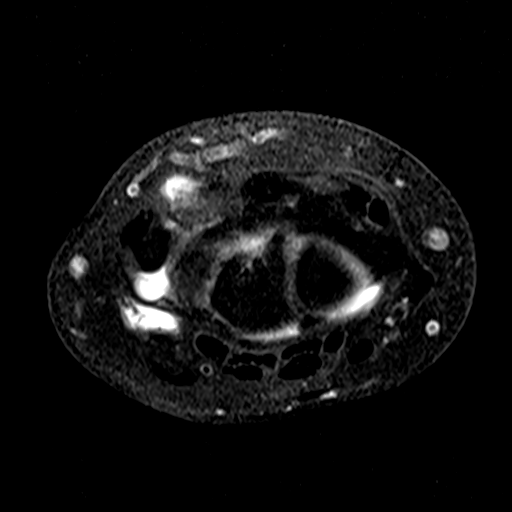
[im 17/25]
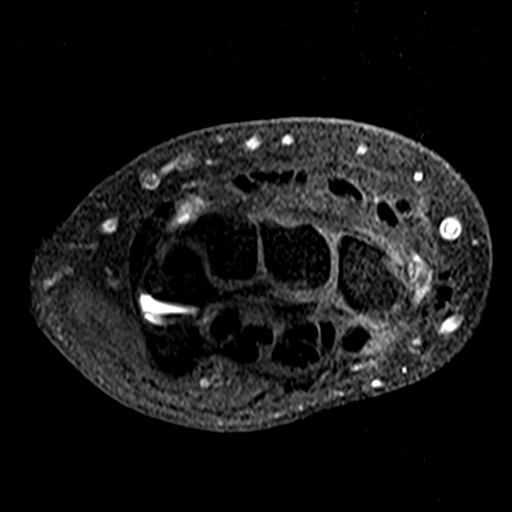
[im 21/25]
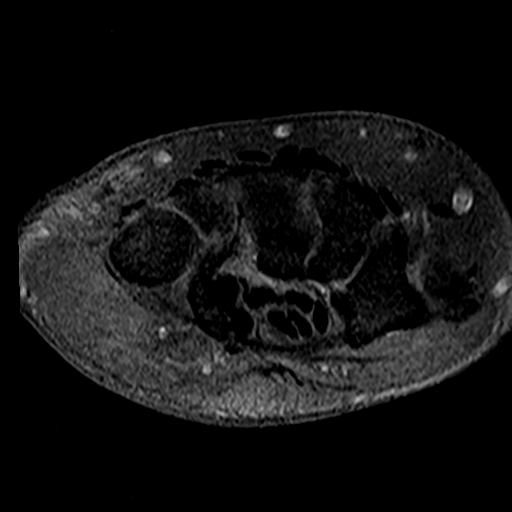
[im 25/25]
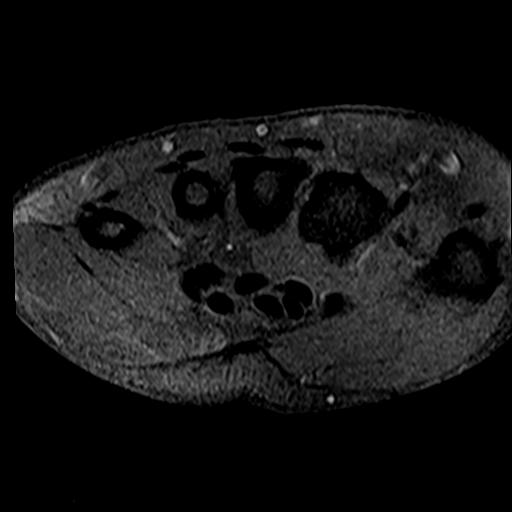

[Series 4: T2 fat-sat · axial · 3.0mm · 0.47mm/px · z∈[-10,+72]mm · 8 of 25 slices shown (1 of 2)]
[im 1/25]
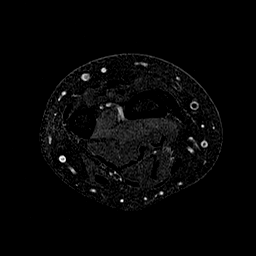
[im 4/25]
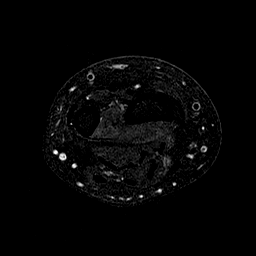
[im 7/25]
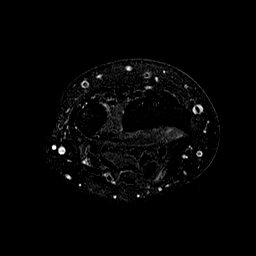
[im 11/25]
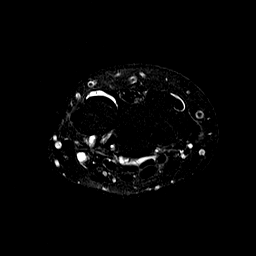
[im 14/25]
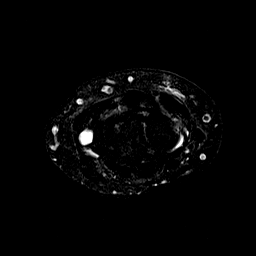
[im 18/25]
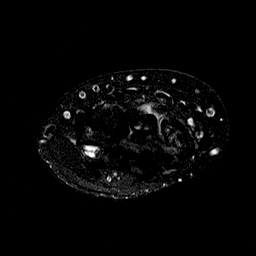
[im 21/25]
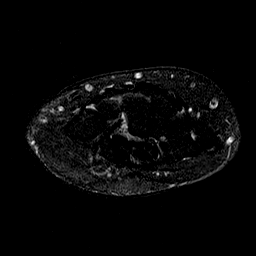
[im 25/25]
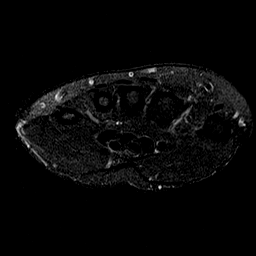

[Series 5: t1_tse_cor_fs · coronal · 3.0mm · 0.47mm/px · 6 of 19 slices shown]
[im 1/19]
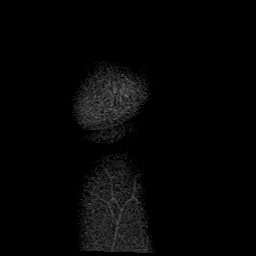
[im 4/19]
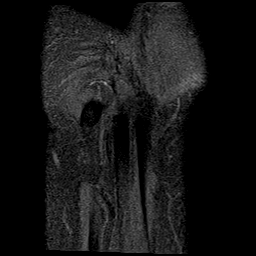
[im 8/19]
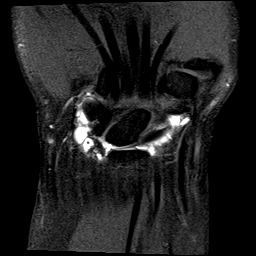
[im 11/19]
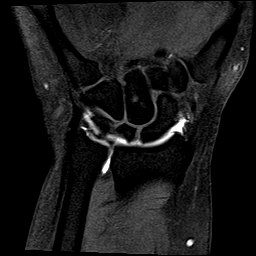
[im 15/19]
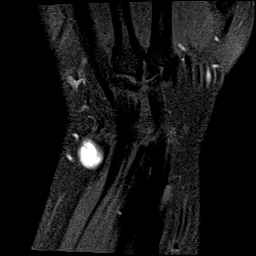
[im 19/19]
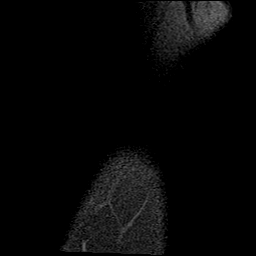

[Series 6: t1_tse_cor_no fs · coronal · 3.0mm · 0.47mm/px · 6 of 19 slices shown]
[im 1/19]
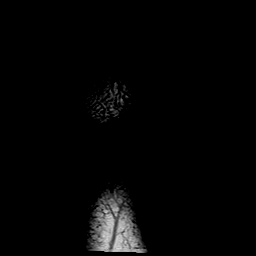
[im 4/19]
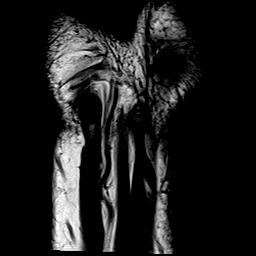
[im 8/19]
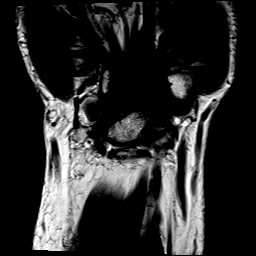
[im 11/19]
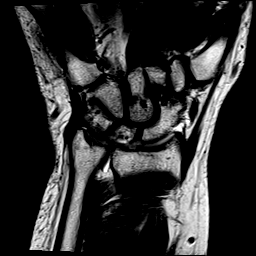
[im 15/19]
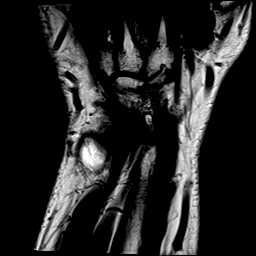
[im 19/19]
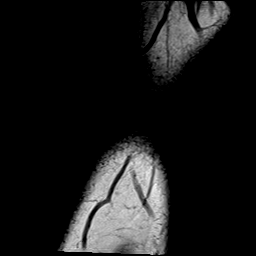

[Series 7: T2 fat-sat · coronal · 3.0mm · 0.41mm/px · 6 of 19 slices shown (2 of 2)]
[im 1/19]
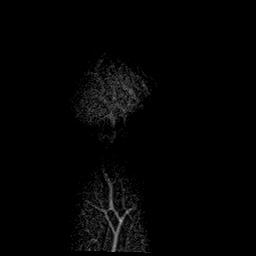
[im 4/19]
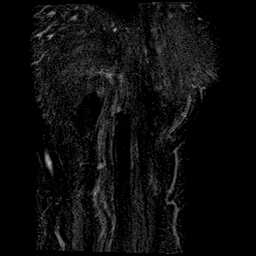
[im 8/19]
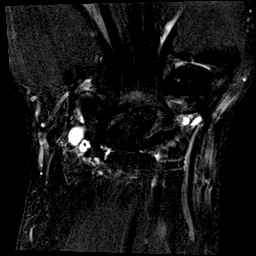
[im 11/19]
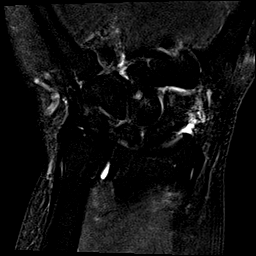
[im 15/19]
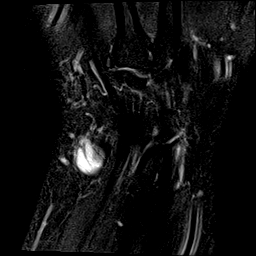
[im 19/19]
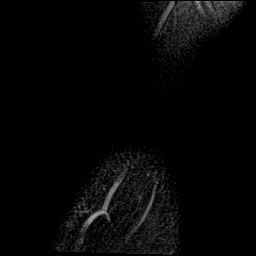

[Series 8: t1_tse_sag_fs · sagittal · 3.0mm · 0.39mm/px · 7 of 24 slices shown]
[im 1/24]
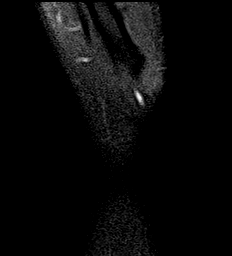
[im 4/24]
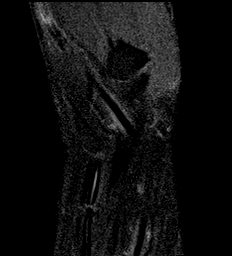
[im 8/24]
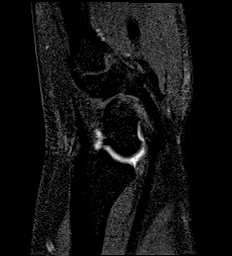
[im 12/24]
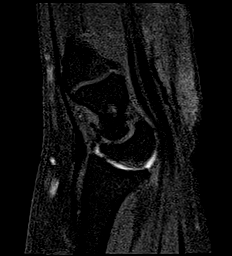
[im 16/24]
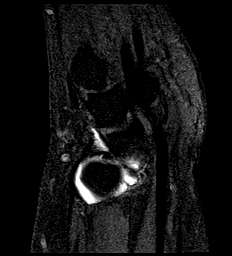
[im 20/24]
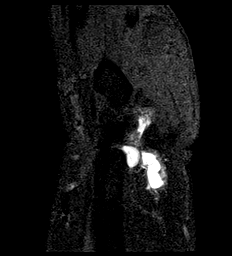
[im 24/24]
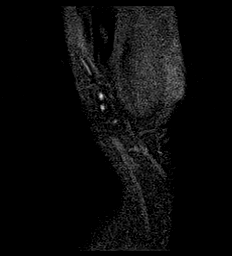

[40 of 40 positions shown; findings below may reference images not displayed]

FINDINGS: Ligaments: Partial tear involving the interosseous component of the
scapholunate ligament. The volar and dorsal components appear
intact. The lunotriquetral ligament is intact.

Triangular fibrocartilage: Degeneration of the volar radioulnar
ligament and articular disc with suspected tears at the radial and
ulnar attachments of the volar radioulnar ligament. Contrast
extension into the distal radioulnar joint.

Tendons: Intact flexor and extensor compartment tendons.

Carpal tunnel/median nerve: Normal carpal tunnel. Normal median
nerve.

Guyon's canal: Normal.

Joint/cartilage: Moderate scaphotrapeziotrapezoid osteoarthritis.
The radiocarpal joint is distended with intra-articular contrast.

Bones/carpal alignment: No acute fracture or dislocation. No
suspicious bone lesion. Normal carpal alignment.

Other: None.
IMPRESSION: 1. Degeneration of the TFCC volar radioulnar ligament and articular
disc with suspected tears at the radial and ulnar attachments of the
volar radioulnar ligament.
2. Partial tear involving the interosseous component of the
scapholunate ligament.
3. Moderate scaphotrapeziotrapezoid osteoarthritis. No acute osseous
abnormality.

## 2021-07-26 ENCOUNTER — Ambulatory Visit (AMBULATORY_SURGERY_CENTER): Payer: Medicare Other | Admitting: *Deleted

## 2021-07-26 ENCOUNTER — Other Ambulatory Visit: Payer: Self-pay

## 2021-07-26 VITALS — Ht 71.0 in | Wt 330.0 lb

## 2021-07-26 DIAGNOSIS — Z86010 Personal history of colonic polyps: Secondary | ICD-10-CM

## 2021-07-26 MED ORDER — CLENPIQ 10-3.5-12 MG-GM -GM/160ML PO SOLN: 1.0000 | ORAL | 0 refills | Status: DC

## 2021-07-26 NOTE — Progress Notes (Signed)
No egg or soy allergy known to patient  No issues known to pt with past sedation with any surgeries or procedures Patient denies ever being told they had issues or difficulty with intubation  No FH of Malignant Hyperthermia Pt is not on diet pills Pt is not on  home 02  Pt is not on blood thinners  Pt denies issues with constipation  No A fib or A flutter  Pt is fully vaccinated  for Covid   Clenpiq Coupon given to pt in PV today , Code to Pharmacy and  NO PA's for preps discussed with pt In PV today  Discussed with pt there will be an out-of-pocket cost for prep and that varies from $0 to 70 +  dollars - pt verbalized understanding   Due to the COVID-19 pandemic we are asking patients to follow certain guidelines in PV and the [facility]   Pt aware of COVID protocols and [facility] guidelines   Pt verified name, DOB, address and insurance during PV today.  Pt mailed instruction packet of Emmi video, copy of consent form to read and not return, and instructions. Clenpiq  coupon mailed in packet. PV completed over the phone.  Pt encouraged to call with questions or issues.  My Chart instructions to pt as well

## 2021-08-09 ENCOUNTER — Encounter: Payer: Self-pay | Admitting: Gastroenterology

## 2021-08-09 ENCOUNTER — Other Ambulatory Visit: Payer: Self-pay

## 2021-08-09 ENCOUNTER — Ambulatory Visit (AMBULATORY_SURGERY_CENTER): Payer: Medicare Other | Admitting: Gastroenterology

## 2021-08-09 VITALS — BP 101/50 | HR 58 | Temp 97.4°F | Resp 19 | Ht 71.0 in | Wt 330.0 lb

## 2021-08-09 DIAGNOSIS — Z86010 Personal history of colonic polyps: Secondary | ICD-10-CM | POA: Diagnosis not present

## 2021-08-09 MED ORDER — SODIUM CHLORIDE 0.9 % IV SOLN: 500.0000 mL | Freq: Once | INTRAVENOUS | Status: DC

## 2021-08-09 NOTE — Patient Instructions (Signed)
YOU HAD AN ENDOSCOPIC PROCEDURE TODAY AT THE [facility]:   Refer to the procedure report that was given to you for any specific questions about what was found during the examination.  If the procedure report does not answer your questions, please call your gastroenterologist to clarify.  If you requested that your care partner not be given the details of your procedure findings, then the procedure report has been included in a sealed envelope for you to review at your convenience later. ° °YOU SHOULD EXPECT: Some feelings of bloating in the abdomen. Passage of more gas than usual.  Walking can help get rid of the air that was put into your GI tract during the procedure and reduce the bloating. If you had a lower endoscopy (such as a colonoscopy or flexible sigmoidoscopy) you may notice spotting of blood in your stool or on the toilet paper. If you underwent a bowel prep for your procedure, you may not have a normal bowel movement for a few days. ° °Please Note:  You might notice some irritation and congestion in your nose or some drainage.  This is from the oxygen used during your procedure.  There is no need for concern and it should clear up in a day or so. ° °SYMPTOMS TO REPORT IMMEDIATELY: ° °Following lower endoscopy (colonoscopy or flexible sigmoidoscopy): ° Excessive amounts of blood in the stool ° Significant tenderness or worsening of abdominal pains ° Swelling of the abdomen that is new, acute ° Fever of 100°F or higher ° °For urgent or emergent issues, a gastroenterologist can be reached at any hour by calling [phone]. °Do not use MyChart messaging for urgent concerns.  ° ° °DIET:  We do recommend a small meal at first, but then you may proceed to your regular diet.  Drink plenty of fluids but you should avoid alcoholic beverages for 24 hours. ° °ACTIVITY:  You should plan to take it easy for the rest of today and you should NOT DRIVE or use heavy machinery until tomorrow (because of  the sedation medicines used during the test).   ° °FOLLOW UP: °Our staff will call the number listed on your records 48-72 hours following your procedure to check on you and address any questions or concerns that you may have regarding the information given to you following your procedure. If we do not reach you, we will leave a message.  We will attempt to reach you two times.  During this call, we will ask if you have developed any symptoms of COVID 19. If you develop any symptoms (ie: fever, flu-like symptoms, shortness of breath, cough etc.) before then, please call [phone].  If you test positive for Covid 19 in the 2 weeks post procedure, please call and report this information to us.   ° °SIGNATURES/CONFIDENTIALITY: °You and/or your care partner have signed paperwork which will be entered into your electronic medical record.  These signatures attest to the fact that that the information above on your After Visit Summary has been reviewed and is understood.  Full responsibility of the confidentiality of this discharge information lies with you and/or your care-partner.  °

## 2021-08-09 NOTE — Progress Notes (Signed)
Pt's states no medical or surgical changes since previsit or office visit. VS assessed by C.W 

## 2021-08-09 NOTE — Progress Notes (Signed)
Pt Drowsy. VSS. To PACU, report to RN. No anesthetic complications noted.  

## 2021-08-09 NOTE — Progress Notes (Signed)
[facility] History and Physical   Primary Care Physician:  Mateo Flow, MD   Reason for Procedure:   History of polyps  Plan:     colonoscopy     HPI: Trevor Adams is a 65 y.o. male    Past Medical History:  Diagnosis Date   Arthritis    Back pain    Cancer ([place])    skin cancer removed   CPAP (continuous positive airway pressure) dependence    Diabetes mellitus without complication ([place])    type II - on metformin   GERD (gastroesophageal reflux disease)    Hypertension    Peripheral edema    Sleep apnea    uses cpap    Past Surgical History:  Procedure Laterality Date   CHOLECYSTECTOMY     COLONOSCOPY     POLYPECTOMY     ROTATOR CUFF REPAIR     bilateral   TONSILLECTOMY     1960   WRIST ARTHROSCOPY Left 11/26/2018   Procedure: ARTHROSCOPY LEFT WRIST,  DEBRIDEMENT OF TRIANGULAR FIBROCARTILAGE TEAR;  Surgeon: Daryll Brod, MD;  Location: [facility];  Service: Orthopedics;  Laterality: Left;    Prior to Admission medications   Medication Sig Start Date End Date Taking? Authorizing Provider  amLODipine (NORVASC) 10 MG tablet Take 10 mg by mouth daily.   Yes [provider]  aspirin EC 81 MG tablet Take 81 mg by mouth daily.   Yes [provider]  atorvastatin (LIPITOR) 20 MG tablet Take 20 mg by mouth daily.   Yes [provider]  celecoxib (CELEBREX) 200 MG capsule TAKE 1 CAPSULE ONCE A DAY WITH FOOD 02/17/20  Yes [provider]  furosemide (LASIX) 20 MG tablet Take 20 mg by mouth.   Yes [provider]  gabapentin (NEURONTIN) 300 MG capsule Take 300 mg by mouth 3 (three) times daily. 07/03/21  Yes [provider]  meloxicam (MOBIC) 15 MG tablet Take 15 mg by mouth daily.   Yes [provider]  metFORMIN (GLUCOPHAGE-XR) 750 MG 24 hr tablet Take 750 mg by mouth 2 (two) times daily. 05/13/21  Yes [provider]  metoprolol succinate (TOPROL-XL) 50 MG 24 hr  tablet Take 50 mg by mouth daily. Take with or immediately following a meal.   Yes [provider]  Multiple Vitamin (MULTIVITAMIN WITH MINERALS) TABS tablet Take 1 tablet by mouth daily.   Yes [provider]  omeprazole (PRILOSEC) 20 MG capsule Take 20 mg by mouth daily. 07/03/21  Yes [provider]  tamsulosin (FLOMAX) 0.4 MG CAPS capsule Take 0.4 mg by mouth daily. 07/10/21  Yes [provider]  valACYclovir (VALTREX) 1000 MG tablet Take 1,000 mg by mouth 3 (three) times daily. 06/04/21  Yes [provider]  valsartan-hydrochlorothiazide (DIOVAN-HCT) 320-25 MG tablet Take 1 tablet by mouth daily.   Yes [provider]    Current Outpatient Medications  Medication Sig Dispense Refill   amLODipine (NORVASC) 10 MG tablet Take 10 mg by mouth daily.     aspirin EC 81 MG tablet Take 81 mg by mouth daily.     atorvastatin (LIPITOR) 20 MG tablet Take 20 mg by mouth daily.     celecoxib (CELEBREX) 200 MG capsule TAKE 1 CAPSULE ONCE A DAY WITH FOOD     furosemide (LASIX) 20 MG tablet Take 20 mg by mouth.     gabapentin (NEURONTIN) 300 MG capsule Take 300 mg by mouth 3 (three) times daily.  meloxicam (MOBIC) 15 MG tablet Take 15 mg by mouth daily.     metFORMIN (GLUCOPHAGE-XR) 750 MG 24 hr tablet Take 750 mg by mouth 2 (two) times daily.     metoprolol succinate (TOPROL-XL) 50 MG 24 hr tablet Take 50 mg by mouth daily. Take with or immediately following a meal.     Multiple Vitamin (MULTIVITAMIN WITH MINERALS) TABS tablet Take 1 tablet by mouth daily.     omeprazole (PRILOSEC) 20 MG capsule Take 20 mg by mouth daily.     tamsulosin (FLOMAX) 0.4 MG CAPS capsule Take 0.4 mg by mouth daily.     valACYclovir (VALTREX) 1000 MG tablet Take 1,000 mg by mouth 3 (three) times daily.     valsartan-hydrochlorothiazide (DIOVAN-HCT) 320-25 MG tablet Take 1 tablet by mouth daily.     Current Facility-Administered Medications  Medication Dose Route  Frequency Provider Last Rate Last Admin   0.9 %  sodium chloride infusion  500 mL Intravenous Once Jackquline Denmark, MD        Allergies as of 08/09/2021 - Review Complete 08/09/2021  Allergen Reaction Noted   Azithromycin Hives and Rash 09/27/2018   Penicillin g Hives 04/18/2017    Family History  Problem Relation Age of Onset   Diabetes Mother    Healthy Father    Colon cancer Neg Hx    Colon polyps Neg Hx    Esophageal cancer Neg Hx    Rectal cancer Neg Hx    Stomach cancer Neg Hx     Social History   Socioeconomic History   Marital status: Married    Spouse name: Not on file   Number of children: Not on file   Years of education: Not on file   Highest education level: Not on file  Occupational History   Not on file  Tobacco Use   Smoking status: Never   Smokeless tobacco: Never  Vaping Use   Vaping Use: Never used  Substance and Sexual Activity   Alcohol use: Not Currently   Drug use: Never   Sexual activity: Not on file  Other Topics Concern   Not on file  Social History Narrative   Lives with wife   Social Determinants of Health   Financial Resource Strain: Not on file  Food Insecurity: Not on file  Transportation Needs: Not on file  Physical Activity: Not on file  Stress: Not on file  Social Connections: Not on file  Intimate Partner Violence: Not on file    Review of Systems: Positive for none All other review of systems negative except as mentioned in the HPI.  Physical Exam: Vital signs in last 24 hours: @VSRANGES @   General:   Alert,  Well-developed, well-nourished, pleasant and cooperative in NAD Lungs:  Clear throughout to auscultation.   Heart:  Regular rate and rhythm; no murmurs, clicks, rubs,  or gallops. Abdomen:  Soft, nontender and nondistended. Normal bowel sounds.   Neuro/Psych:  Alert and cooperative. Normal mood and affect. A and O x 3    No significant changes were identified.  The patient continues to be an appropriate  candidate for the planned procedure and anesthesia.   Carmell Austria, MD. [facility] 08/09/2021 9:13 AM@

## 2021-08-09 NOTE — Op Note (Signed)
[facility] Patient Name: Trevor Adams Procedure Date: 08/09/2021 9:16 AM MRN: [id number] Endoscopist: Jackquline Denmark , MD Age: 65 Referring MD:  Date of Birth: Jul 04, 1956 Gender: Male Account #: [id number] Procedure:                Colonoscopy Indications:              High risk colon cancer surveillance: Personal                            history of colonic polyps Medicines:                Monitored Anesthesia Care Procedure:                Pre-Anesthesia Assessment:                           - Prior to the procedure, a History and Physical                            was performed, and patient medications and                            allergies were reviewed. The patient's tolerance of                            previous anesthesia was also reviewed. The risks                            and benefits of the procedure and the sedation                            options and risks were discussed with the patient.                            All questions were answered, and informed consent                            was obtained. Prior Anticoagulants: The patient has                            taken no previous anticoagulant or antiplatelet                            agents. ASA Grade Assessment: III - A patient with                            severe systemic disease. After reviewing the risks                            and benefits, the patient was deemed in                            satisfactory condition to undergo the procedure.  After obtaining informed consent, the colonoscope                            was passed under direct vision. Throughout the                            procedure, the patient's blood pressure, pulse, and                            oxygen saturations were monitored continuously. The                            #[id number] was introduced through the anus                            and advanced to the the cecum,  identified by                            appendiceal orifice and ileocecal valve. The                            colonoscopy was performed without difficulty. The                            patient tolerated the procedure well. The quality                            of the bowel preparation was adequate to identify                            polyps 6 mm and larger in size. Exam was limited                            due to quality of preparation. Some retained solid                            stool throughout the colon more prominently in the                            cecum. Approximately 85 to 90% of the colonic                            mucosa was visualized satisfactorily. The ileocecal                            valve, appendiceal orifice, and rectum were                            photographed. Scope In: 9:24:09 AM Scope Out: 9:41:02 AM Scope Withdrawal Time: 0 hours 11 minutes 33 seconds  Total Procedure Duration: 0 hours 16 minutes 53 seconds  Findings:                 Multiple medium-mouthed diverticula were found in  the sigmoid colon, descending colon and few in                            ascending colon. Several diverticuli with stool                            impacted.                           Non-bleeding internal hemorrhoids were found during                            retroflexion. The hemorrhoids were moderate and                            Grade I (internal hemorrhoids that do not prolapse).                           The exam was otherwise without abnormality on                            direct and retroflexion views. Complications:            No immediate complications. Estimated Blood Loss:     Estimated blood loss: none. Impression:               - Pancolonic diverticulosis predominantly in the                            sigmoid colon.                           - Non-bleeding internal hemorrhoids.                           - The  examination was otherwise normal on direct                            and retroflexion views.                           - No specimens collected. Recommendation:           - Patient has a contact number available for                            emergencies. The signs and symptoms of potential                            delayed complications were discussed with the                            patient. Return to normal activities tomorrow.                            Written discharge instructions were provided to the  patient.                           - High fiber diet.                           - Continue present medications.                           - Repeat colonoscopy in 5 years for screening                            purposes with 2-day prep. Certainly, earlier if                            with any new problems or change in family history.                           - The findings and recommendations were discussed                            with the patient's family. Jackquline Denmark, MD 08/09/2021 9:47:30 AM This report has been signed electronically.

## 2021-08-11 ENCOUNTER — Telehealth: Payer: Self-pay

## 2021-08-11 NOTE — Telephone Encounter (Signed)
  Follow up Call-  Call back number 08/09/2021  Post procedure Call Back phone  # [phone]  Permission to leave phone message Yes  Some recent data might be hidden     Patient questions:  Do you have a fever, pain , or abdominal swelling? No. Pain Score  0 *  Have you tolerated food without any problems? Yes.    Have you been able to return to your normal activities? Yes.    Do you have any questions about your discharge instructions: Diet   No. Medications  No. Follow up visit  No.  Do you have questions or concerns about your Care? No.  Actions: * If pain score is 4 or above: No action needed, pain <4.  Have you developed a fever since your procedure? no  2.   Have you had an respiratory symptoms (SOB or cough) since your procedure? no  3.   Have you tested positive for COVID 19 since your procedure no  4.   Have you had any family members/close contacts diagnosed with the COVID 19 since your procedure?  no   If yes to any of these questions please route to Joylene John, RN and Joella Prince, RN
